# Patient Record
Sex: Female | Born: 1950 | Race: White | Hispanic: No | Marital: Married | Smoking: Never smoker
Health system: Southern US, Community
[De-identification: ages and names within clinical notes are randomized; demographics above are authoritative.]

---

## 1997-07-23 ENCOUNTER — Ambulatory Visit (HOSPITAL_COMMUNITY): Admission: RE | Admit: 1997-07-23 | Discharge: 1997-07-23 | Payer: Self-pay | Admitting: Obstetrics and Gynecology

## 2000-03-11 ENCOUNTER — Other Ambulatory Visit: Admission: RE | Admit: 2000-03-11 | Discharge: 2000-03-11 | Payer: Self-pay | Admitting: Obstetrics and Gynecology

## 2003-10-02 ENCOUNTER — Other Ambulatory Visit: Admission: RE | Admit: 2003-10-02 | Discharge: 2003-10-02 | Payer: Self-pay | Admitting: Obstetrics and Gynecology

## 2004-11-14 ENCOUNTER — Encounter: Admission: RE | Admit: 2004-11-14 | Discharge: 2004-11-14 | Payer: Self-pay | Admitting: Family Medicine

## 2004-12-04 ENCOUNTER — Encounter: Admission: RE | Admit: 2004-12-04 | Discharge: 2004-12-04 | Payer: Self-pay | Admitting: Family Medicine

## 2004-12-18 ENCOUNTER — Encounter: Admission: RE | Admit: 2004-12-18 | Discharge: 2004-12-18 | Payer: Self-pay | Admitting: Family Medicine

## 2005-09-28 ENCOUNTER — Encounter: Admission: RE | Admit: 2005-09-28 | Discharge: 2005-09-28 | Payer: Self-pay | Admitting: Family Medicine

## 2005-10-19 ENCOUNTER — Encounter: Admission: RE | Admit: 2005-10-19 | Discharge: 2005-10-19 | Payer: Self-pay | Admitting: Family Medicine

## 2006-05-20 ENCOUNTER — Encounter: Admission: RE | Admit: 2006-05-20 | Discharge: 2006-05-20 | Payer: Self-pay | Admitting: Family Medicine

## 2009-05-17 ENCOUNTER — Encounter (INDEPENDENT_AMBULATORY_CARE_PROVIDER_SITE_OTHER): Payer: Self-pay | Admitting: *Deleted

## 2009-06-04 ENCOUNTER — Encounter: Payer: Self-pay | Admitting: Gastroenterology

## 2010-04-06 ENCOUNTER — Encounter: Payer: Self-pay | Admitting: Family Medicine

## 2010-04-15 NOTE — Letter (Signed)
Summary: Previsit letter  [facility]  [address] [place], [place] [address]   Phone: [phone]  Fax: [phone]       05/17/2009 MRN: [id number]  [facility] [place] [address] [place], [place]  [address]  Dear Ms. Straughter,  Welcome to the Gastroenterology Division at [facility].    You are scheduled to see a nurse for your pre-procedure visit on 06-04-09 at 4:00p.m. on the 3rd floor at [facility], [address]  We ask that you try to arrive at our office 15 minutes prior to your appointment time to allow for check-in.  Your nurse visit will consist of discussing your medical and surgical history, your immediate family medical history, and your medications.    Please bring a complete list of all your medications or, if you prefer, bring the medication bottles and we will list them.  We will need to be aware of both prescribed and over the counter drugs.  We will need to know exact dosage information as well.  If you are on blood thinners (Coumadin, Plavix, Aggrenox, Ticlid, etc.) please call our office today/prior to your appointment, as we need to consult with your physician about holding your medication.   Please be prepared to read and sign documents such as consent forms, a financial agreement, and acknowledgement forms.  If necessary, and with your consent, a friend or relative is welcome to sit-in on the nurse visit with you.  Please bring your insurance card so that we may make a copy of it.  If your insurance requires a referral to see a specialist, please bring your referral form from your primary care physician.  No co-pay is required for this nurse visit.     If you cannot keep your appointment, please call [phone] to cancel or reschedule prior to your appointment date.  This allows [place] the opportunity to schedule an appointment for another patient in need of care.    Thank you for choosing [facility] for your medical needs.  We  appreciate the opportunity to care for you.  Please visit [place] at our website  to learn more about our practice.                     Sincerely.                                                                                                                   The Gastroenterology Division

## 2010-04-15 NOTE — Letter (Signed)
Summary: Pre Visit No Show Letter  [facility]  [address] [place], [place] [address]   Phone: [phone]  Fax: [phone]        June 04, 2009 MRN: [id number]    [facility] [address] [place], [place]  [address]    Dear Ms. Gowin,   We have been unable to reach you by phone concerning the pre-procedure visit that you missed on 06/04/09. For this reason,your procedure scheduled on 06/18/09 has been cancelled. Our scheduling staff will gladly assist you with rescheduling your appointments at a more convenient time. Please call our office at [phone] between the hours of 8:00am and 5:00pm, press option #2 to reach an appointment scheduler. Please consider updating your contact numbers at this time so that we can reach you by phone in the future with schedule changes or results.    Thank you,    Wyona Almas RN [facility]

## 2016-03-13 ENCOUNTER — Ambulatory Visit
Admission: RE | Admit: 2016-03-13 | Discharge: 2016-03-13 | Disposition: A | Discharge status: Home / Self Care | Payer: BLUE CROSS/BLUE SHIELD | Source: Ambulatory Visit | Attending: Internal Medicine | Admitting: Internal Medicine

## 2016-03-13 ENCOUNTER — Other Ambulatory Visit: Payer: Self-pay | Admitting: Internal Medicine

## 2016-03-13 DIAGNOSIS — R059 Cough, unspecified: Secondary | ICD-10-CM

## 2016-03-13 DIAGNOSIS — R05 Cough: Secondary | ICD-10-CM

## 2017-02-06 ENCOUNTER — Emergency Department (HOSPITAL_BASED_OUTPATIENT_CLINIC_OR_DEPARTMENT_OTHER): Payer: BLUE CROSS/BLUE SHIELD

## 2017-02-06 ENCOUNTER — Emergency Department (HOSPITAL_BASED_OUTPATIENT_CLINIC_OR_DEPARTMENT_OTHER)
Admission: EM | Admit: 2017-02-06 | Discharge: 2017-02-06 | Disposition: A | Discharge status: Home / Self Care | Payer: BLUE CROSS/BLUE SHIELD | Attending: Emergency Medicine | Admitting: Emergency Medicine

## 2017-02-06 ENCOUNTER — Other Ambulatory Visit: Payer: Self-pay

## 2017-02-06 ENCOUNTER — Encounter (HOSPITAL_BASED_OUTPATIENT_CLINIC_OR_DEPARTMENT_OTHER): Payer: Self-pay | Admitting: Emergency Medicine

## 2017-02-06 DIAGNOSIS — S29019A Strain of muscle and tendon of unspecified wall of thorax, initial encounter: Secondary | ICD-10-CM | POA: Insufficient documentation

## 2017-02-06 DIAGNOSIS — Y929 Unspecified place or not applicable: Secondary | ICD-10-CM | POA: Insufficient documentation

## 2017-02-06 DIAGNOSIS — Y999 Unspecified external cause status: Secondary | ICD-10-CM | POA: Insufficient documentation

## 2017-02-06 DIAGNOSIS — R109 Unspecified abdominal pain: Secondary | ICD-10-CM

## 2017-02-06 DIAGNOSIS — Y939 Activity, unspecified: Secondary | ICD-10-CM | POA: Insufficient documentation

## 2017-02-06 DIAGNOSIS — Y33XXXA Other specified events, undetermined intent, initial encounter: Secondary | ICD-10-CM | POA: Insufficient documentation

## 2017-02-06 DIAGNOSIS — S299XXA Unspecified injury of thorax, initial encounter: Secondary | ICD-10-CM | POA: Diagnosis present

## 2017-02-06 LAB — CBC WITH DIFFERENTIAL/PLATELET
Basophils Absolute: 0 10*3/uL (ref 0.0–0.1)
Basophils Relative: 0 %
Eosinophils Absolute: 0.2 10*3/uL (ref 0.0–0.7)
Eosinophils Relative: 2 %
HCT: 40.2 % (ref 36.0–46.0)
Hemoglobin: 13.6 g/dL (ref 12.0–15.0)
Lymphocytes Relative: 13 %
Lymphs Abs: 1.6 10*3/uL (ref 0.7–4.0)
MCH: 28.5 pg (ref 26.0–34.0)
MCHC: 33.8 g/dL (ref 30.0–36.0)
MCV: 84.3 fL (ref 78.0–100.0)
Monocytes Absolute: 0.8 10*3/uL (ref 0.1–1.0)
Monocytes Relative: 6 %
Neutro Abs: 9.3 10*3/uL — ABNORMAL HIGH (ref 1.7–7.7)
Neutrophils Relative %: 78 %
Platelets: 347 10*3/uL (ref 150–400)
RBC: 4.77 MIL/uL (ref 3.87–5.11)
RDW: 12.8 % (ref 11.5–15.5)
WBC: 11.9 10*3/uL — ABNORMAL HIGH (ref 4.0–10.5)

## 2017-02-06 LAB — URINALYSIS, ROUTINE W REFLEX MICROSCOPIC
Bilirubin Urine: NEGATIVE
Glucose, UA: NEGATIVE mg/dL
Ketones, ur: NEGATIVE mg/dL
Nitrite: NEGATIVE
Protein, ur: NEGATIVE mg/dL
Specific Gravity, Urine: <1.005 — ABNORMAL LOW (ref 1.005–1.030)
pH: 6 (ref 5.0–8.0)

## 2017-02-06 LAB — URINALYSIS, MICROSCOPIC (REFLEX)

## 2017-02-06 LAB — COMPREHENSIVE METABOLIC PANEL
ALT: 41 U/L (ref 14–54)
AST: 34 U/L (ref 15–41)
Albumin: 3.8 g/dL (ref 3.5–5.0)
Alkaline Phosphatase: 79 U/L (ref 38–126)
Anion gap: 6 (ref 5–15)
BUN: 13 mg/dL (ref 6–20)
CO2: 26 mmol/L (ref 22–32)
Calcium: 8.6 mg/dL — ABNORMAL LOW (ref 8.9–10.3)
Chloride: 103 mmol/L (ref 101–111)
Creatinine, Ser: 0.63 mg/dL (ref 0.44–1.00)
GFR calc Af Amer: >60 mL/min (ref >60)
GFR calc non Af Amer: >60 mL/min (ref >60)
Glucose, Bld: 114 mg/dL — ABNORMAL HIGH (ref 65–99)
Potassium: 4 mmol/L (ref 3.5–5.1)
Sodium: 135 mmol/L (ref 135–145)
Total Bilirubin: 0.8 mg/dL (ref 0.3–1.2)
Total Protein: 6.7 g/dL (ref 6.5–8.1)

## 2017-02-06 LAB — LIPASE, BLOOD: Lipase: 27 U/L (ref 11–51)

## 2017-02-06 LAB — D-DIMER, QUANTITATIVE: D-Dimer, Quant: 0.35 ug/mL-FEU (ref 0.00–0.50)

## 2017-02-06 MED ORDER — ONDANSETRON HCL 4 MG/2ML IJ SOLN
4.0000 mg | Freq: Once | INTRAMUSCULAR | Status: AC
  Administered 2017-02-06: 4 mg via INTRAVENOUS
  Filled 2017-02-06: qty 2

## 2017-02-06 MED ORDER — SODIUM CHLORIDE 0.9 % IV SOLN
INTRAVENOUS | Status: DC
  Administered 2017-02-06: 10:00:00 via INTRAVENOUS

## 2017-02-06 MED ORDER — MORPHINE SULFATE (PF) 2 MG/ML IV SOLN
2.0000 mg | Freq: Once | INTRAVENOUS | Status: AC
  Administered 2017-02-06: 2 mg via INTRAVENOUS
  Filled 2017-02-06: qty 1

## 2017-02-06 MED ORDER — HYDROCODONE-ACETAMINOPHEN 5-325 MG PO TABS: ORAL_TABLET | ORAL | 0 refills | Status: AC

## 2017-02-06 MED ORDER — KETOROLAC TROMETHAMINE 30 MG/ML IJ SOLN
30.0000 mg | Freq: Once | INTRAMUSCULAR | Status: AC
  Administered 2017-02-06: 30 mg via INTRAVENOUS
  Filled 2017-02-06: qty 1

## 2017-02-06 NOTE — ED Notes (Signed)
Patient transported to Ultrasound 

## 2017-02-06 NOTE — ED Triage Notes (Signed)
patient reports that she is having upper right sided quad pain - reports that she is having nausea as well

## 2017-02-06 NOTE — Discharge Instructions (Addendum)
Take vicodin for breakthrough pain, do not drink alcohol, drive, care for children or do other critical tasks while taking vicodin.  It is very important that you take deep breaths to prevent lung collapse and infection.  Either use your incentive spirometer or take 10 deep breaths every hour to prevent lung collapse.  If you develop cough, fever or shortness of breath return immediately to the emergency room.   Starting tomorrow: For pain control please take ibuprofen (also known as Motrin or Advil) 800mg  (this is normally 4 over the counter pills) 3 times a day  for 5 days. Take with food to minimize stomach irritation.

## 2017-02-06 NOTE — ED Provider Notes (Signed)
[facility] EMERGENCY DEPARTMENT Provider Note   CSN: [id number] Arrival date & time: 02/06/17  [id number]     History   Chief Complaint Chief Complaint  Patient presents with  . Abdominal Pain     HPI   Blood pressure (!) 158/103, pulse 77, temperature 98.3 F (36.8 C), resp. rate (!) 24, height 5\' 3"  (1.6 m), weight 77.1 kg (170 lb), SpO2 98 %.  Bethany Wolf is a 66 y.o. female complaining of severe right-sided abdominal pain with associated productive cough and low-grade fever onset approximately 3 weeks ago, initially this started as a cough, she was seen at urgent care 1 week ago and was started on Keflex after receiving a shot of antibiotic and steroids, she states that they made her sick starting with nausea and profuse diarrhea so she stopped taking them.  She has a severe pain in the right chest that is not exacerbated postprandially is not reproducible to palpation and is pleuritic in nature.  She denies hemoptysis, prior DVT or PE, exogenous estrogen, calf pain or leg swelling, recent immobilization, palpitations, presyncope, shortness of breath.  She is taking no pain medication prior to arrival.   History reviewed. No pertinent past medical history.  There are no active problems to display for this patient.   History reviewed. No pertinent surgical history.  OB History    No data available       Home Medications    Prior to Admission medications   Medication Sig Start Date End Date Taking? Authorizing Provider  HYDROcodone-acetaminophen (NORCO/VICODIN) 5-325 MG tablet Take 1-2 tablets by mouth every 6 hours as needed for pain. 02/06/17   Jleigh Striplin, Mardella LaymanNicole, PA-C    Family History History reviewed. No pertinent family history.  Social History Social History   Tobacco Use  . Smoking status: Never Smoker  . Smokeless tobacco: Never Used  Substance Use Topics  . Alcohol use: Yes    Alcohol/week: 0.6 oz    Types: 1 Glasses of wine per week   Frequency: Never  . Drug use: No     Allergies   Patient has no known allergies.   Review of Systems Review of Systems  A complete review of systems was obtained and all systems are negative except as noted in the HPI and PMH.   Physical Exam Updated Vital Signs BP 120/66   Pulse 68   Temp 98.3 F (36.8 C)   Resp 11   Ht 5\' 3"  (1.6 m)   Wt 77.1 kg (170 lb)   SpO2 95%   BMI 30.11 kg/m   Physical Exam  Constitutional: She is oriented to person, place, and time. She appears well-developed and well-nourished. No distress.  Clutching her right chest, appears acutely uncomfortable  HENT:  Head: Normocephalic and atraumatic.  Mouth/Throat: Oropharynx is clear and moist.  Eyes: Conjunctivae and EOM are normal. Pupils are equal, round, and reactive to light.  Neck: Normal range of motion.  Cardiovascular: Normal rate, regular rhythm and intact distal pulses.  Pulmonary/Chest: Effort normal and breath sounds normal. No stridor. No respiratory distress. She has no wheezes. She has no rales. She exhibits tenderness.  Mild right lower chest tenderness, non-reproducible to palpation, no rash  Abdominal: Soft. She exhibits no distension and no mass. There is tenderness. There is no rebound and no guarding. No hernia.  Mild tenderness in the right upper quadrant with no guarding or rebound  Musculoskeletal: Normal range of motion.  Neurological: She is alert and oriented  to person, place, and time.  Skin: Capillary refill takes less than 2 seconds. She is not diaphoretic.  Psychiatric: She has a normal mood and affect.  Nursing note and vitals reviewed.    ED Treatments / Results  Labs (all labs ordered are listed, but only abnormal results are displayed) Labs Reviewed  CBC WITH DIFFERENTIAL/PLATELET - Abnormal; Notable for the following components:      Result Value   WBC 11.9 (*)    Neutro Abs 9.3 (*)    All other components within normal limits  COMPREHENSIVE METABOLIC  PANEL - Abnormal; Notable for the following components:   Glucose, Bld 114 (*)    Calcium 8.6 (*)    All other components within normal limits  URINALYSIS, ROUTINE W REFLEX MICROSCOPIC - Abnormal; Notable for the following components:   Color, Urine STRAW (*)    Specific Gravity, Urine <1.005 (*)    Hgb urine dipstick TRACE (*)    Leukocytes, UA TRACE (*)    All other components within normal limits  URINALYSIS, MICROSCOPIC (REFLEX) - Abnormal; Notable for the following components:   Bacteria, UA RARE (*)    Squamous Epithelial / LPF 6-30 (*)    All other components within normal limits  LIPASE, BLOOD  D-DIMER, QUANTITATIVE (NOT AT [facility])    EKG  EKG Interpretation None       Radiology Dg Ribs Unilateral W/chest Right  Result Date: 02/06/2017 CLINICAL DATA:  Right rib pain after coughing last night. EXAM: RIGHT RIBS AND CHEST - 3+ VIEW COMPARISON:  Radiographs of March 13, 2016. FINDINGS: No fracture or other bone lesions are seen involving the ribs. There is no evidence of pneumothorax or pleural effusion. Both lungs are clear. Heart size and mediastinal contours are within normal limits. IMPRESSION: Normal right ribs.  No acute cardiopulmonary abnormality seen. Electronically Signed   By: Lupita Raider, M.D.   On: 02/06/2017 10:39   US Abdomen Limited Ruq  Result Date: 02/06/2017 CLINICAL DATA:  Right upper quadrant pain with nausea. EXAM: ULTRASOUND ABDOMEN LIMITED RIGHT UPPER QUADRANT COMPARISON:  None. FINDINGS: Gallbladder: No gallstones or wall thickening visualized. No sonographic Murphy sign noted by sonographer. Common bile duct: Diameter: 0.6 cm. Liver: No focal lesion identified. Within normal limits in parenchymal echogenicity. Portal vein is patent on color Doppler imaging with normal direction of blood flow towards the liver. IMPRESSION: Negative right upper quadrant ultrasound. Electronically Signed   By: Richarda Overlie M.D.   On: 02/06/2017 11:38     Procedures Procedures (including critical care time)  Medications Ordered in ED Medications  0.9 %  sodium chloride infusion ( Intravenous Stopped 02/06/17 1301)  morphine 2 MG/ML injection 2 mg (2 mg Intravenous Given 02/06/17 0958)  ondansetron (ZOFRAN) injection 4 mg (4 mg Intravenous Given 02/06/17 0958)  morphine 2 MG/ML injection 2 mg (2 mg Intravenous Given 02/06/17 1130)  ketorolac (TORADOL) 30 MG/ML injection 30 mg (30 mg Intravenous Given 02/06/17 1301)     Initial Impression / Assessment and Plan / ED Course  I have reviewed the triage vital signs and the nursing notes.  Pertinent labs & imaging results that were available during my care of the patient were reviewed by me and considered in my medical decision making (see chart for details).     Vitals:   02/06/17 0931 02/06/17 1030 02/06/17 1130 02/06/17 1230  BP: (!) 158/103 (!) 145/73 (!) 151/76 120/66  Pulse: 77 63 68 68  Resp: (!) 24 11 14  11  Temp: 98.3 F (36.8 C)     SpO2: 98% 96% 96% 95%  Weight:      Height:        Medications  0.9 %  sodium chloride infusion ( Intravenous Stopped 02/06/17 1301)  morphine 2 MG/ML injection 2 mg (2 mg Intravenous Given 02/06/17 0958)  ondansetron (ZOFRAN) injection 4 mg (4 mg Intravenous Given 02/06/17 0958)  morphine 2 MG/ML injection 2 mg (2 mg Intravenous Given 02/06/17 1130)  ketorolac (TORADOL) 30 MG/ML injection 30 mg (30 mg Intravenous Given 02/06/17 1301)    Bethany Wolf is 66 y.o. female presenting with productive cough and severe right sided chest pain, chest pain is not reproducible to palpation, lung sounds are clear.  Initially she is tachycardic, she appears acutely uncomfortable.  Opiate nave, will give 2 mg of morphine.  This pain is not exacerbated postprandially and her abdominal exam is equivocal, will start with d-dimer, basic blood work x-ray and would consider right upper quadrant ultrasound if negative.   D-dimer normal, x-ray without  infiltrate.  Rib series without fracture.  Pain only very mildly improved with 2 mg of morphine, will give second dose and obtain right upper quadrant ultrasound.  It is possible that this is simply a musculoskeletal pain from diffuse coughing secondary to a bronchitis.  Extensive discussion on return precautions and findings with this patient, advised her to restart her Medrol Dosepak at day 3, she will receive Toradol in the ED, will start ibuprofen tomorrow, can take Vicodin starting today and incentive spirometer every hour while awake.  Evaluation does not show pathology that would require ongoing emergent intervention or inpatient treatment. Pt is hemodynamically stable and mentating appropriately. Discussed findings and plan with patient/guardian, who agrees with care plan. All questions answered. Return precautions discussed and outpatient follow up given.    Final Clinical Impressions(s) / ED Diagnoses   Final diagnoses:  Right sided abdominal pain  Acute thoracic myofascial strain, initial encounter    ED Discharge Orders        Ordered    HYDROcodone-acetaminophen (NORCO/VICODIN) 5-325 MG tablet     02/06/17 1257       Rayaan Lorah, Mardella Laymanicole, PA-C 02/06/17 1302    Gwyneth SproutPlunkett, [place], MD 02/07/17 2010

## 2018-08-01 IMAGING — CR DG RIBS W/ CHEST 3+V*R*
5 series · 5 of 5 positions shown · non-contrast
Comparison: Radiographs March 13, 2016.

CLINICAL DATA: Right rib pain after coughing last night.

EXAM:
RIGHT RIBS AND CHEST - 3+ VIEW

[w chest pa]
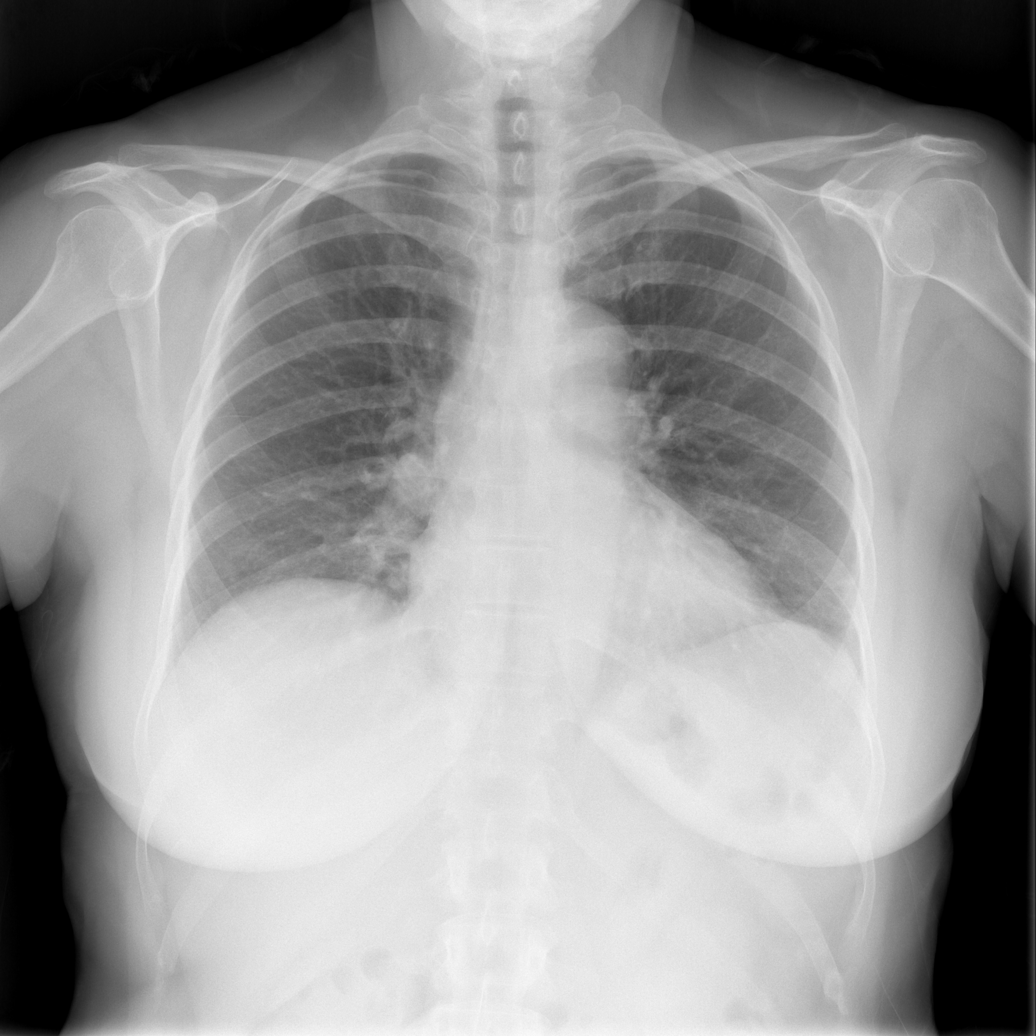

[w ribs ap/pa upper right]
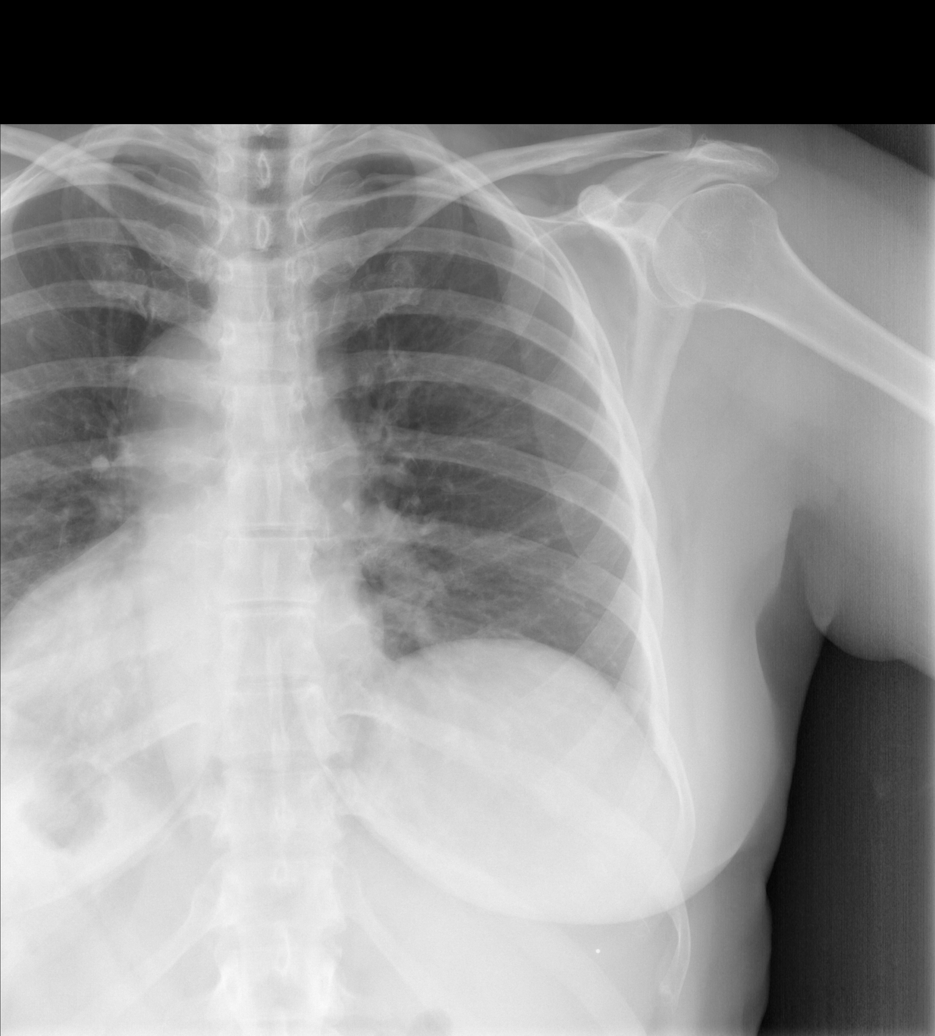

[w ribs ap/pa lower right]
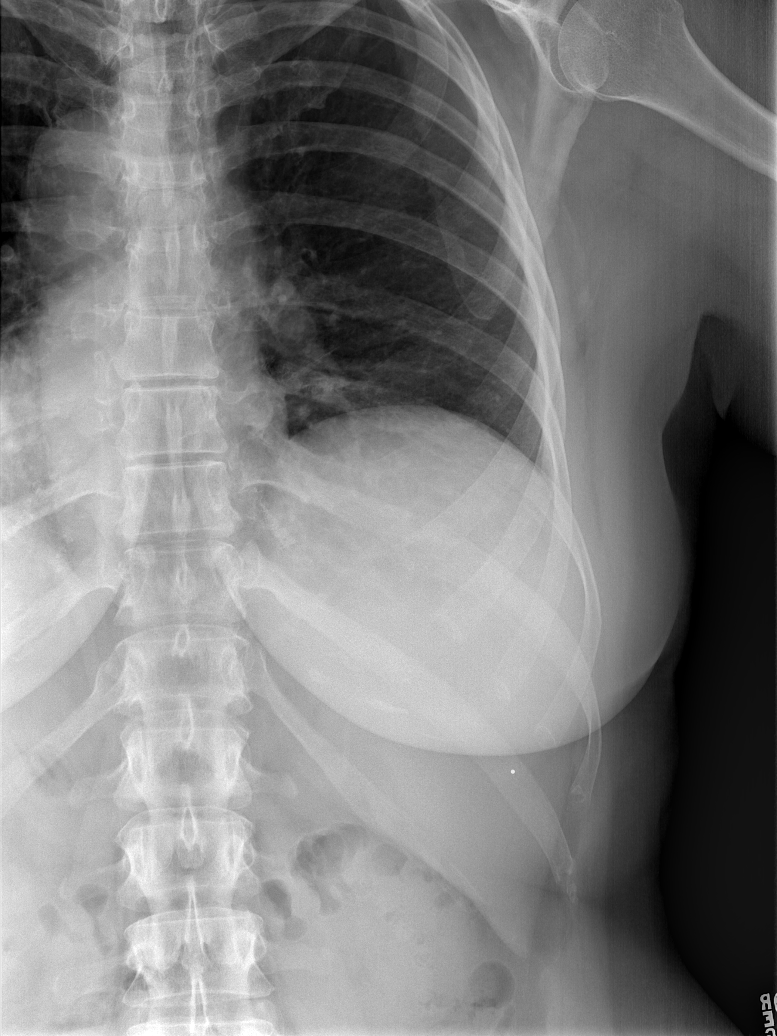

[w ribs oblique right (1 of 2)]
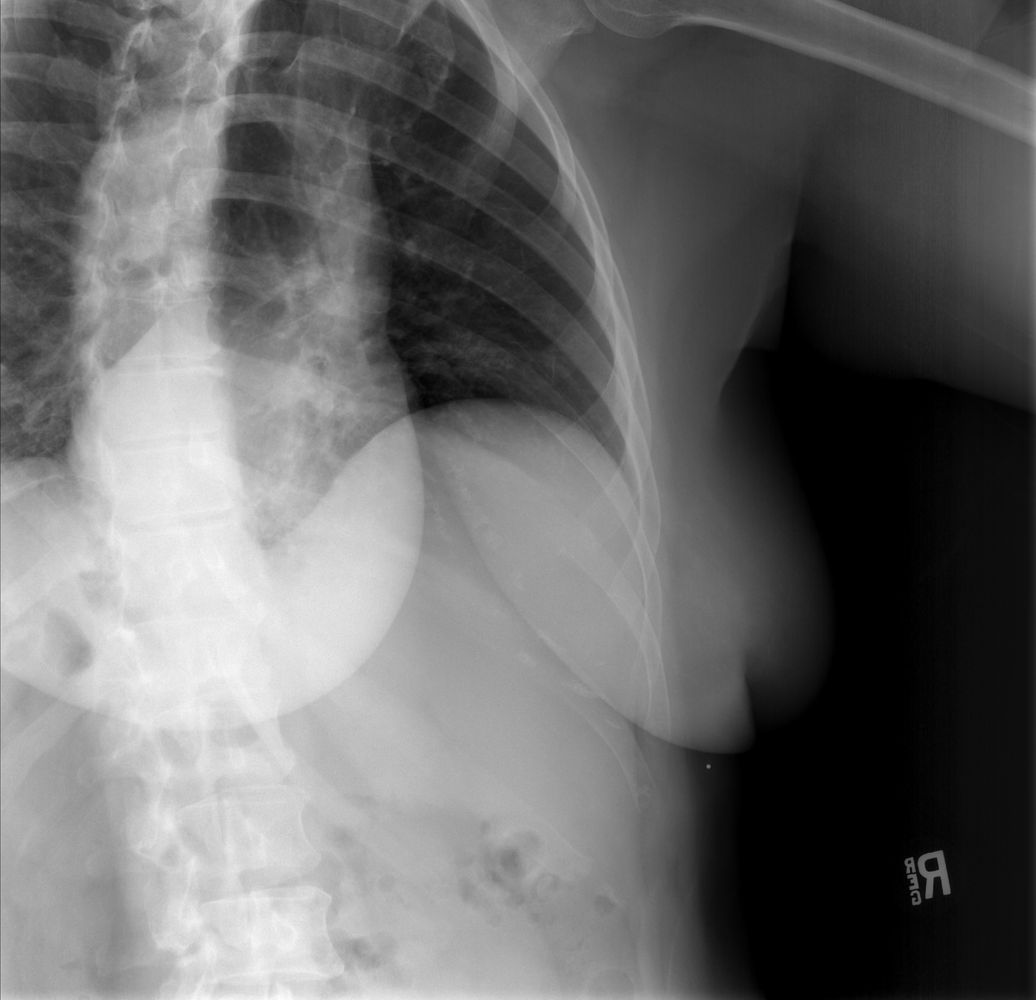

[w ribs oblique right (2 of 2)]
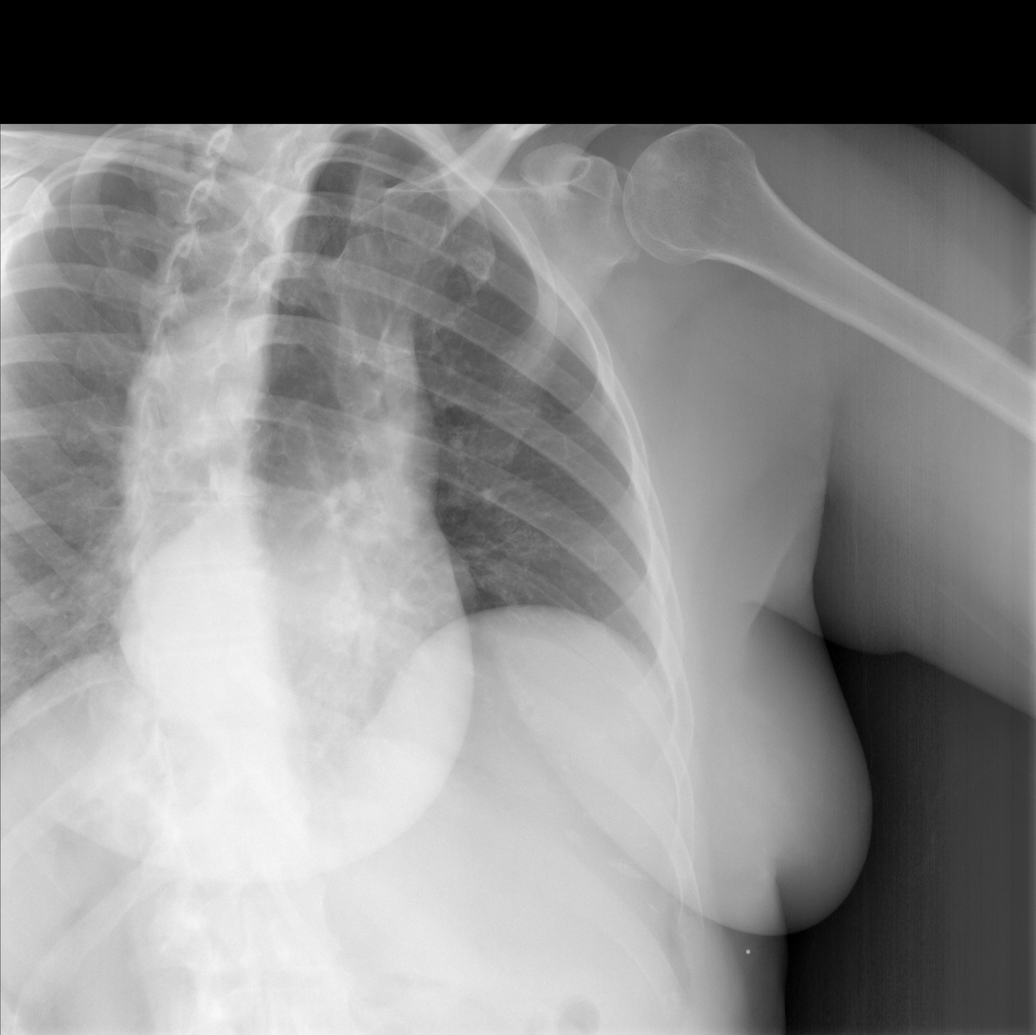

[5 of 5 positions shown; findings below may reference images not displayed]

FINDINGS: No fracture or other bone lesions are seen involving the ribs. There
is no evidence of pneumothorax or pleural effusion. Both lungs are
clear. Heart size and mediastinal contours are within normal limits.
IMPRESSION: Normal right ribs.  No acute cardiopulmonary abnormality seen.

## 2018-12-31 IMAGING — US US ABDOMEN LIMITED
1 series · 14 of 25 positions shown · non-contrast
Comparison: None.

CLINICAL DATA: Right upper quadrant pain with nausea.

EXAM:
ULTRASOUND ABDOMEN LIMITED RIGHT UPPER QUADRANT

[Series 1: us abdomen limited · 0.20mm/px · 14 of 34 slices shown]
[im 1/34]
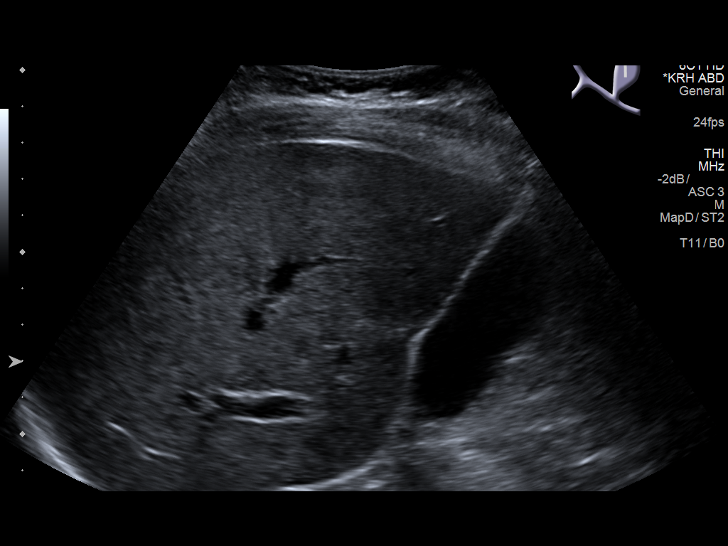
[im 3/34]
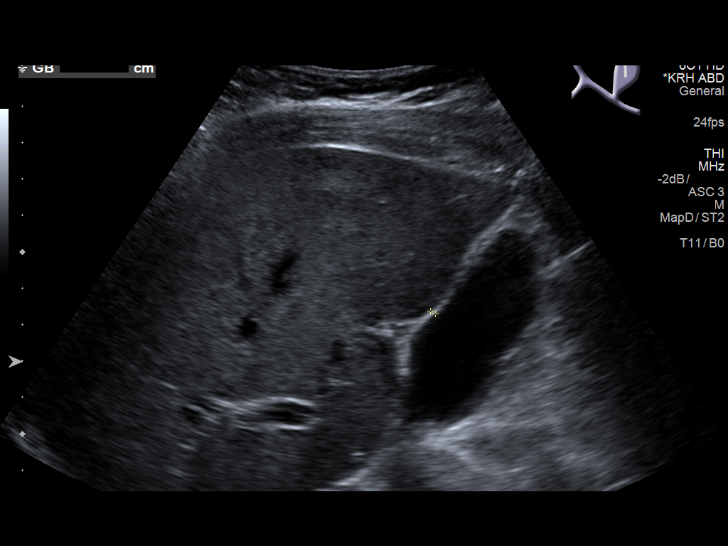
[im 6/34]
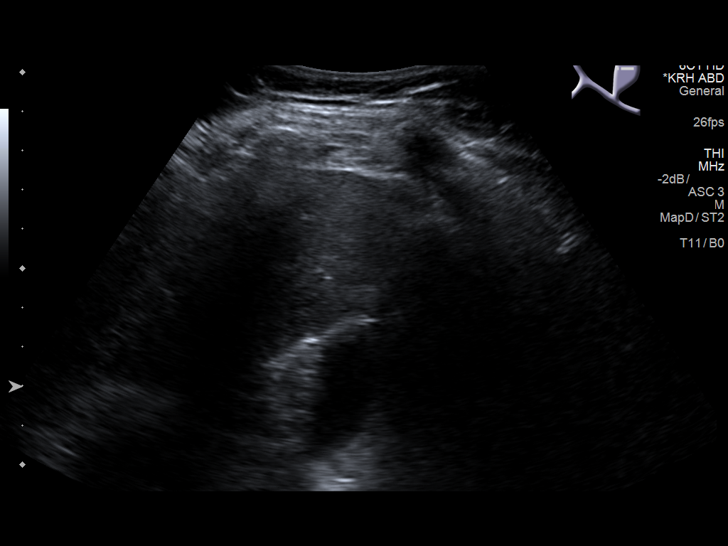
[im 9/34]
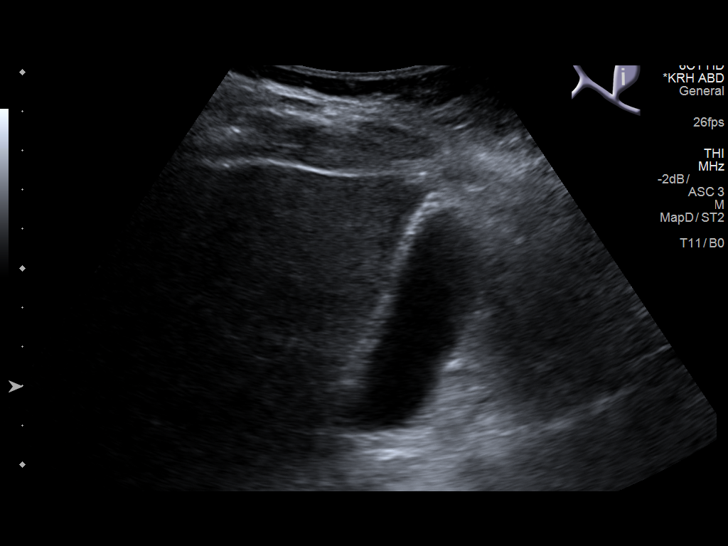
[im 12/34]
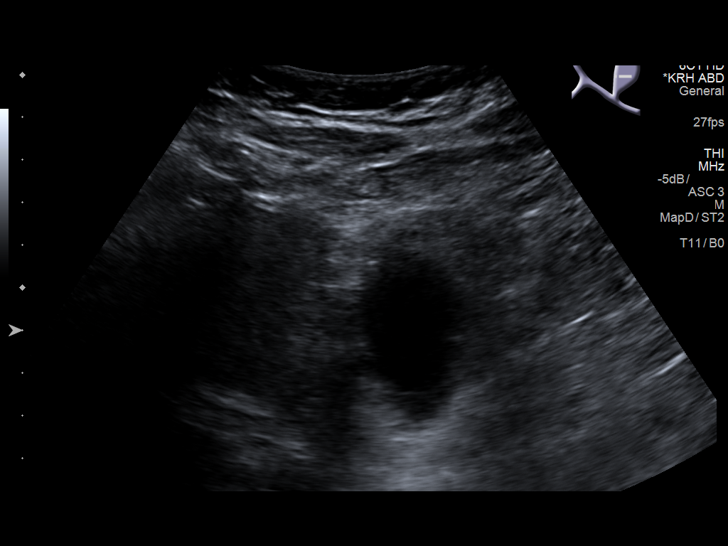
[im 13/34]
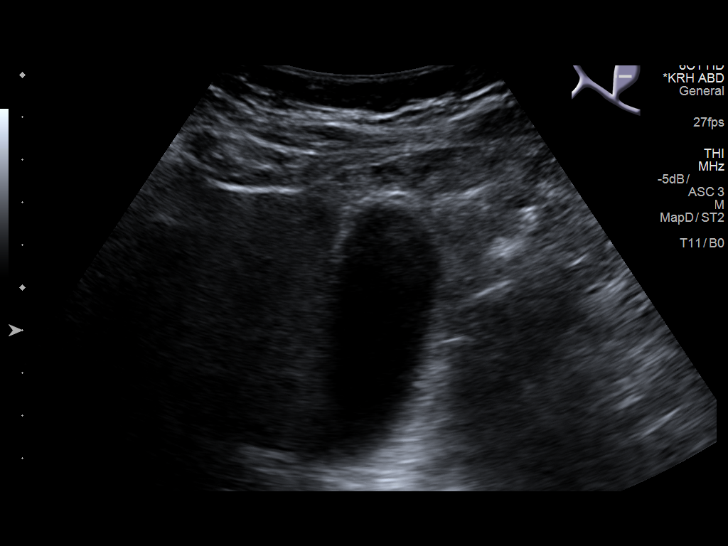
[im 16/34]
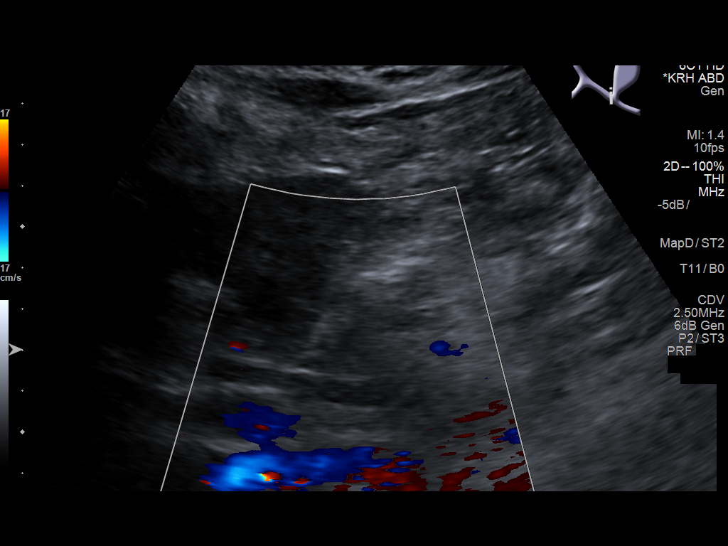
[im 18/34]
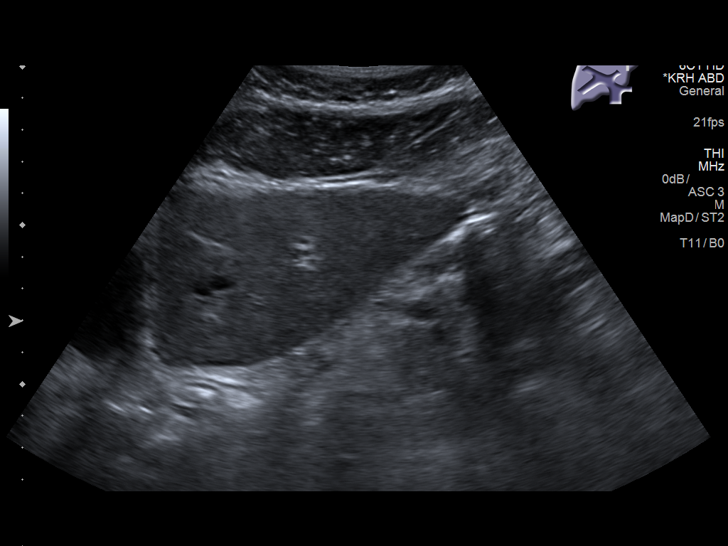
[im 21/34]
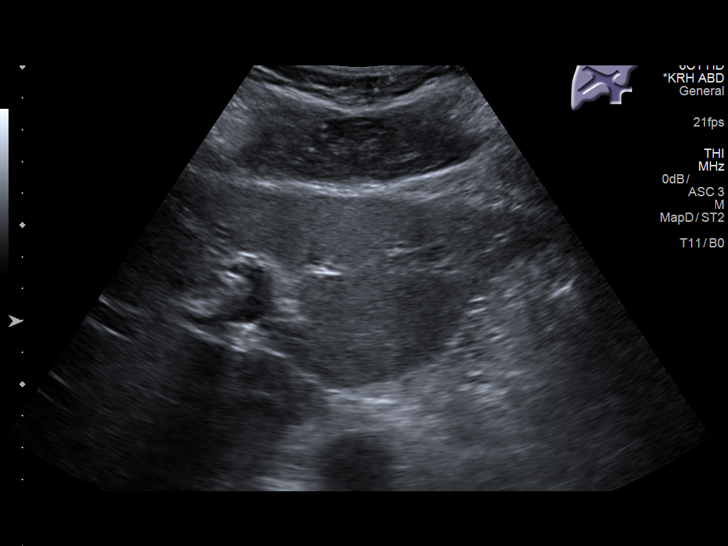
[im 23/34]
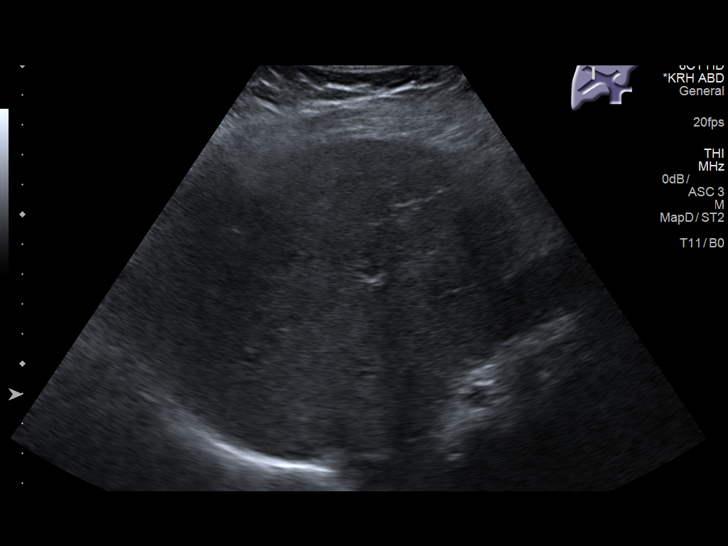
[im 25/34]
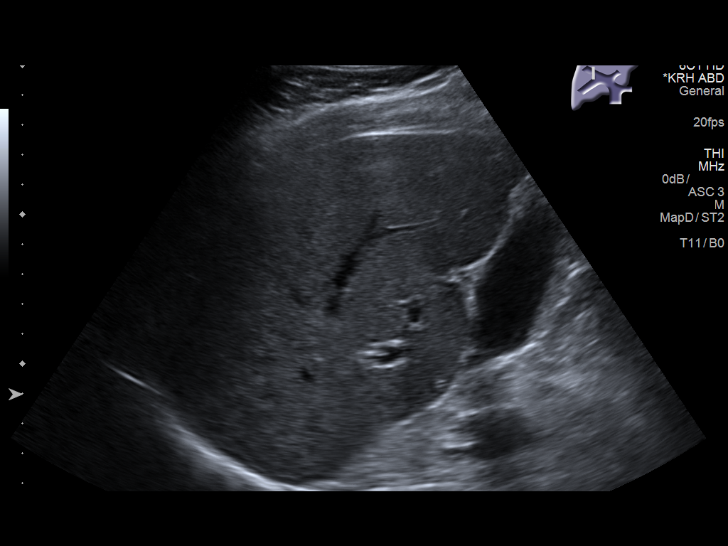
[im 28/34]
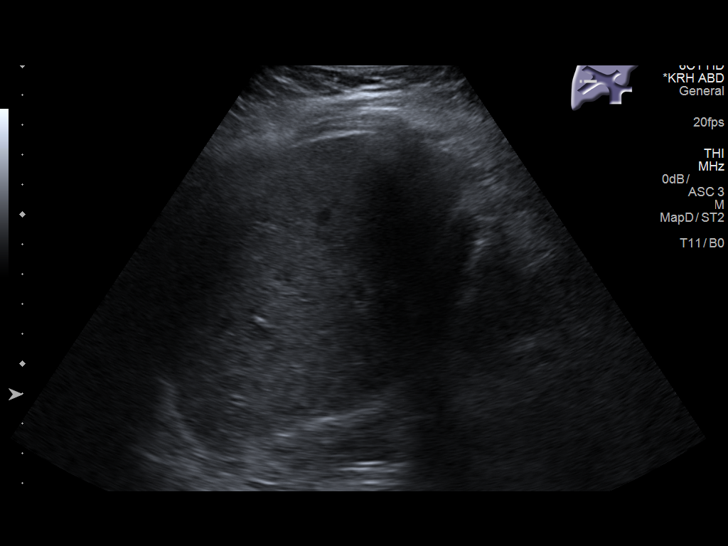
[im 31/34]
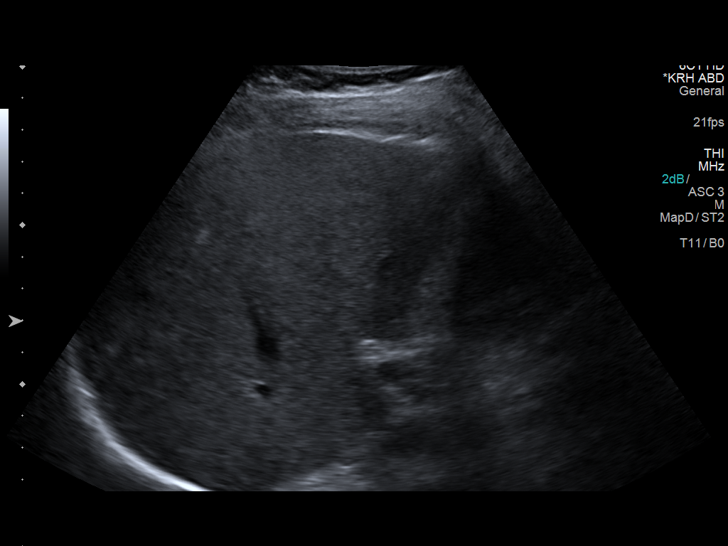
[im 34/34]
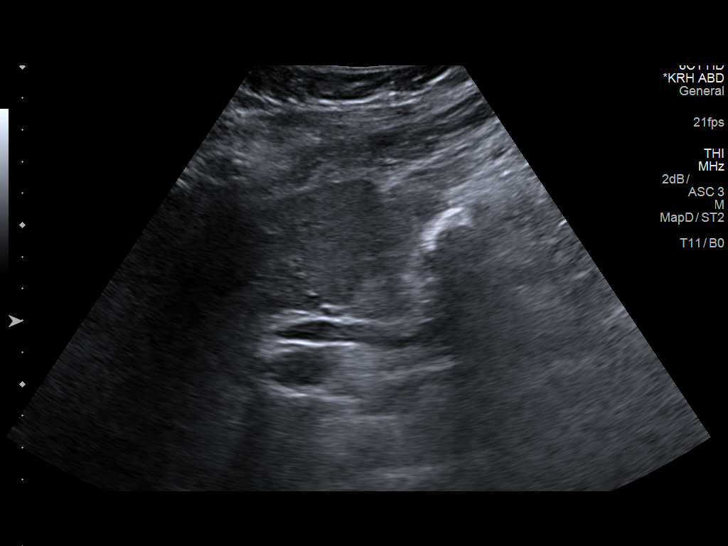

[14 of 25 positions shown; findings below may reference images not displayed]

FINDINGS: Gallbladder:

No gallstones or wall thickening visualized. No sonographic Murphy
sign noted by sonographer.

Common bile duct:

Diameter: 0.6 cm.

Liver:

No focal lesion identified. Within normal limits in parenchymal
echogenicity. Portal vein is patent on color Doppler imaging with
normal direction of blood flow towards the liver.
IMPRESSION: Negative right upper quadrant ultrasound.
